# Patient Record
Sex: Female | Born: 1995 | Race: White | Hispanic: No | Marital: Single | Smoking: Never smoker
Health system: Southern US, Community
[De-identification: ages and names within clinical notes are randomized; demographics above are authoritative.]

## PROBLEM LIST (undated history)

## (undated) DIAGNOSIS — F32A Depression, unspecified: Secondary | ICD-10-CM

## (undated) DIAGNOSIS — F329 Major depressive disorder, single episode, unspecified: Secondary | ICD-10-CM

## (undated) HISTORY — DX: Major depressive disorder, single episode, unspecified: F32.9

## (undated) HISTORY — PX: OTHER SURGICAL HISTORY: SHX169

## (undated) HISTORY — DX: Depression, unspecified: F32.A

---
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
## 2013-06-28 ENCOUNTER — Encounter: Payer: Self-pay | Admitting: Family Medicine

## 2013-06-28 ENCOUNTER — Ambulatory Visit (INDEPENDENT_AMBULATORY_CARE_PROVIDER_SITE_OTHER): Payer: BC Managed Care – PPO | Admitting: Family Medicine

## 2013-06-28 VITALS — BP 123/69 | HR 88 | Wt 109.0 lb

## 2013-06-28 DIAGNOSIS — Z113 Encounter for screening for infections with a predominantly sexual mode of transmission: Secondary | ICD-10-CM

## 2013-06-28 DIAGNOSIS — Z309 Encounter for contraceptive management, unspecified: Secondary | ICD-10-CM

## 2013-06-28 DIAGNOSIS — E162 Hypoglycemia, unspecified: Secondary | ICD-10-CM

## 2013-06-28 MED ORDER — NORGESTIMATE-ETH ESTRADIOL 0.25-35 MG-MCG PO TABS: 1.0000 | ORAL_TABLET | Freq: Every day | ORAL | Status: DC

## 2013-06-28 NOTE — Progress Notes (Signed)
   Subjective:    Patient ID: [name], female    DOB: April 18, 1995, 18 y.o.   MRN: [id number]  HPI Here for followup contraceptive counseling. She is currently happy with her form of birth control pill. She does need refills today. She is currently sexual active. She's not experiencing any pelvic symptoms.  She has some questions about hypoglycemia. She had a syncopal episode after a bad house within couple hours after eating. She's also had a couple of episodes since then where she's woke up feeling a little shaky and with her lips tingling. She says her mom and her grandmother both have hypoglycemic episodes. No family history of significant diabetes. She wants to know she can do to prevent this.  Review of Systems     Objective:   Physical Exam  Constitutional: She is oriented to person, place, and time. She appears well-developed and well-nourished.  HENT:  Head: Normocephalic and atraumatic.  Cardiovascular: Normal rate, regular rhythm and normal heart sounds.   Pulmonary/Chest: Effort normal and breath sounds normal.  Neurological: She is alert and oriented to person, place, and time.  Skin: Skin is warm and dry.  Psychiatric: She has a normal mood and affect. Her behavior is normal.          Assessment & Plan:  contracpetive counseling-she's doing well on the medication. Refill sent to the pharmacy. Blood pressure is well-controlled. Due for yearly urine GC and Chlamydia testing. Followup in one year. Next year she will need a Pap smear.  Hypoglycemia-without actually testing her blood sugar is difficult to confirm the diagnosis 100%. The insurance would not pay for glucometer for that diagnosis. But we discussed eating small frequent meals to keep blood sugar elevated and strategies to treat low blood sugar when it does occur.

## 2013-06-29 LAB — GC/CHLAMYDIA PROBE AMP, URINE
Chlamydia, Swab/Urine, PCR: NEGATIVE
GC Probe Amp, Urine: NEGATIVE

## 2013-07-01 ENCOUNTER — Telehealth: Payer: Self-pay | Admitting: *Deleted

## 2013-07-01 NOTE — Telephone Encounter (Signed)
Error

## 2013-07-22 IMAGING — CR DG WRIST 2V*L*
2 series · 2 of 2 positions shown · non-contrast
Comparison: None.

CLINICAL DATA: Intermittent wrist pain for 1 year.  No previous
relevant surgery.

LEFT WRIST - 2 VIEW

[view not recorded (1 of 2)]
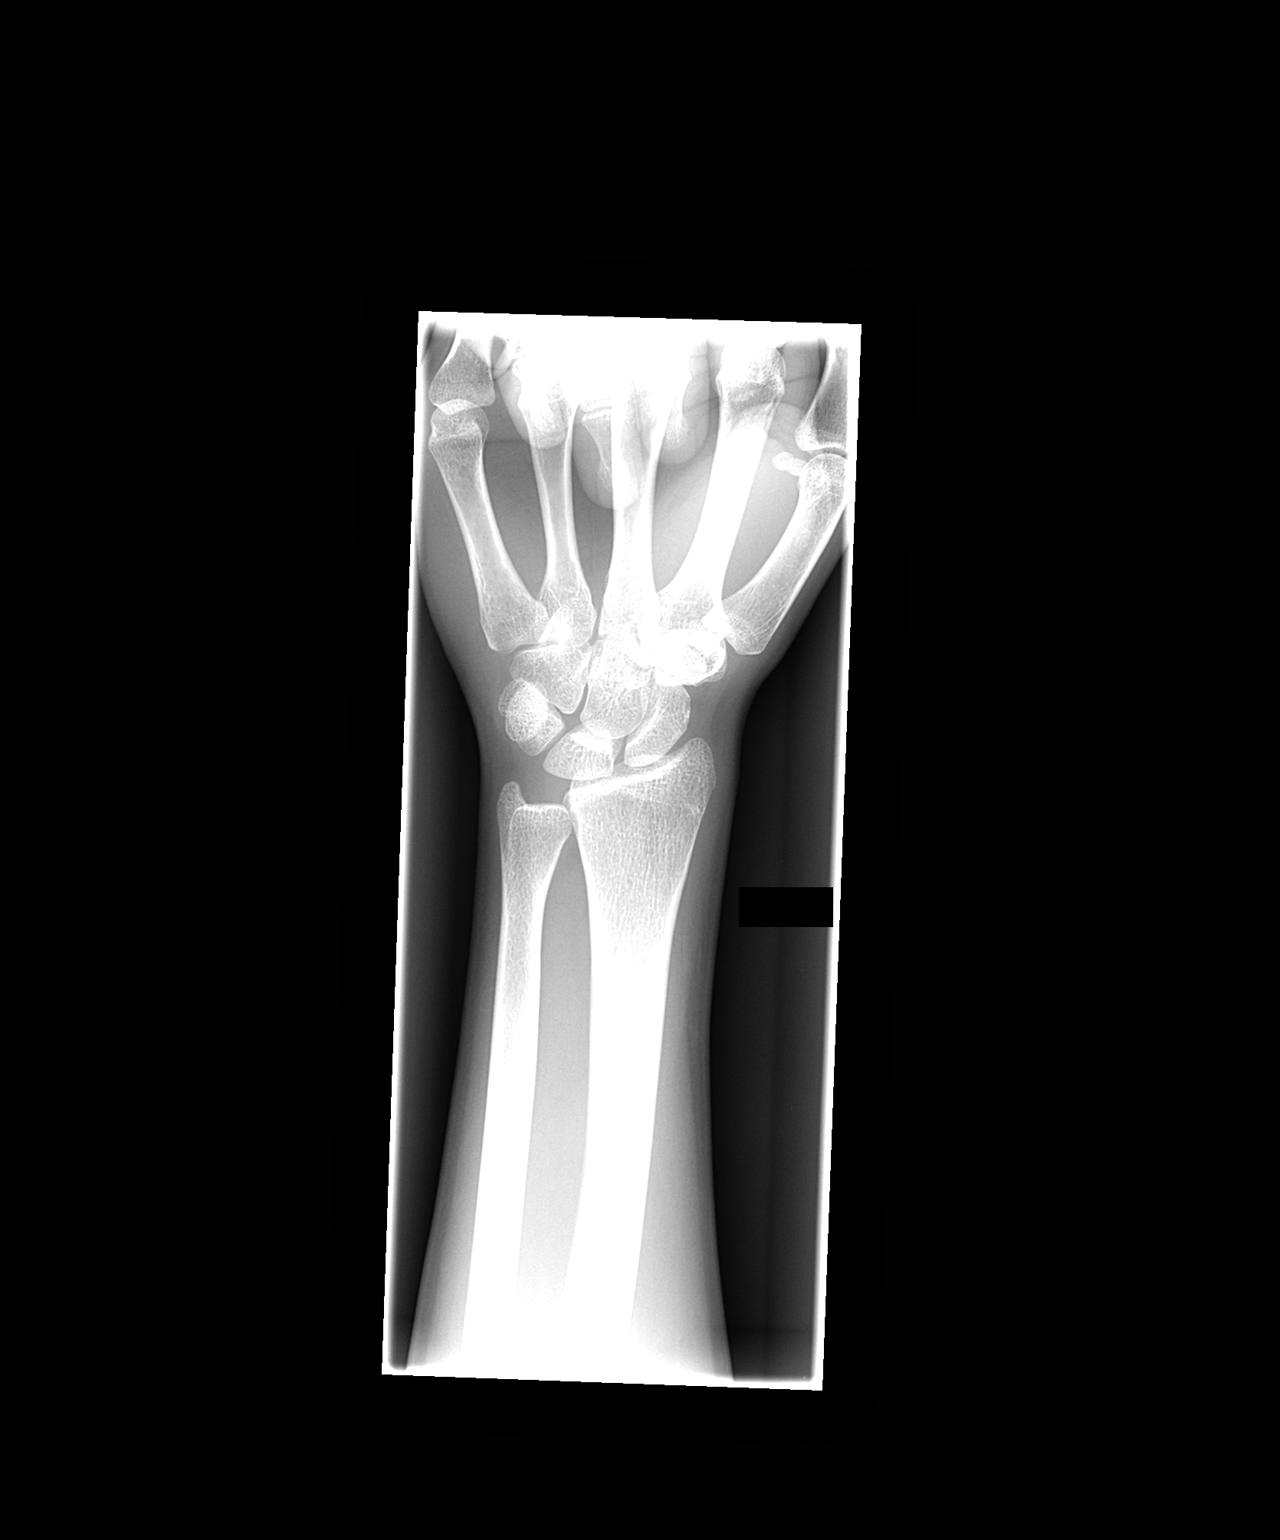

[view not recorded (2 of 2)]
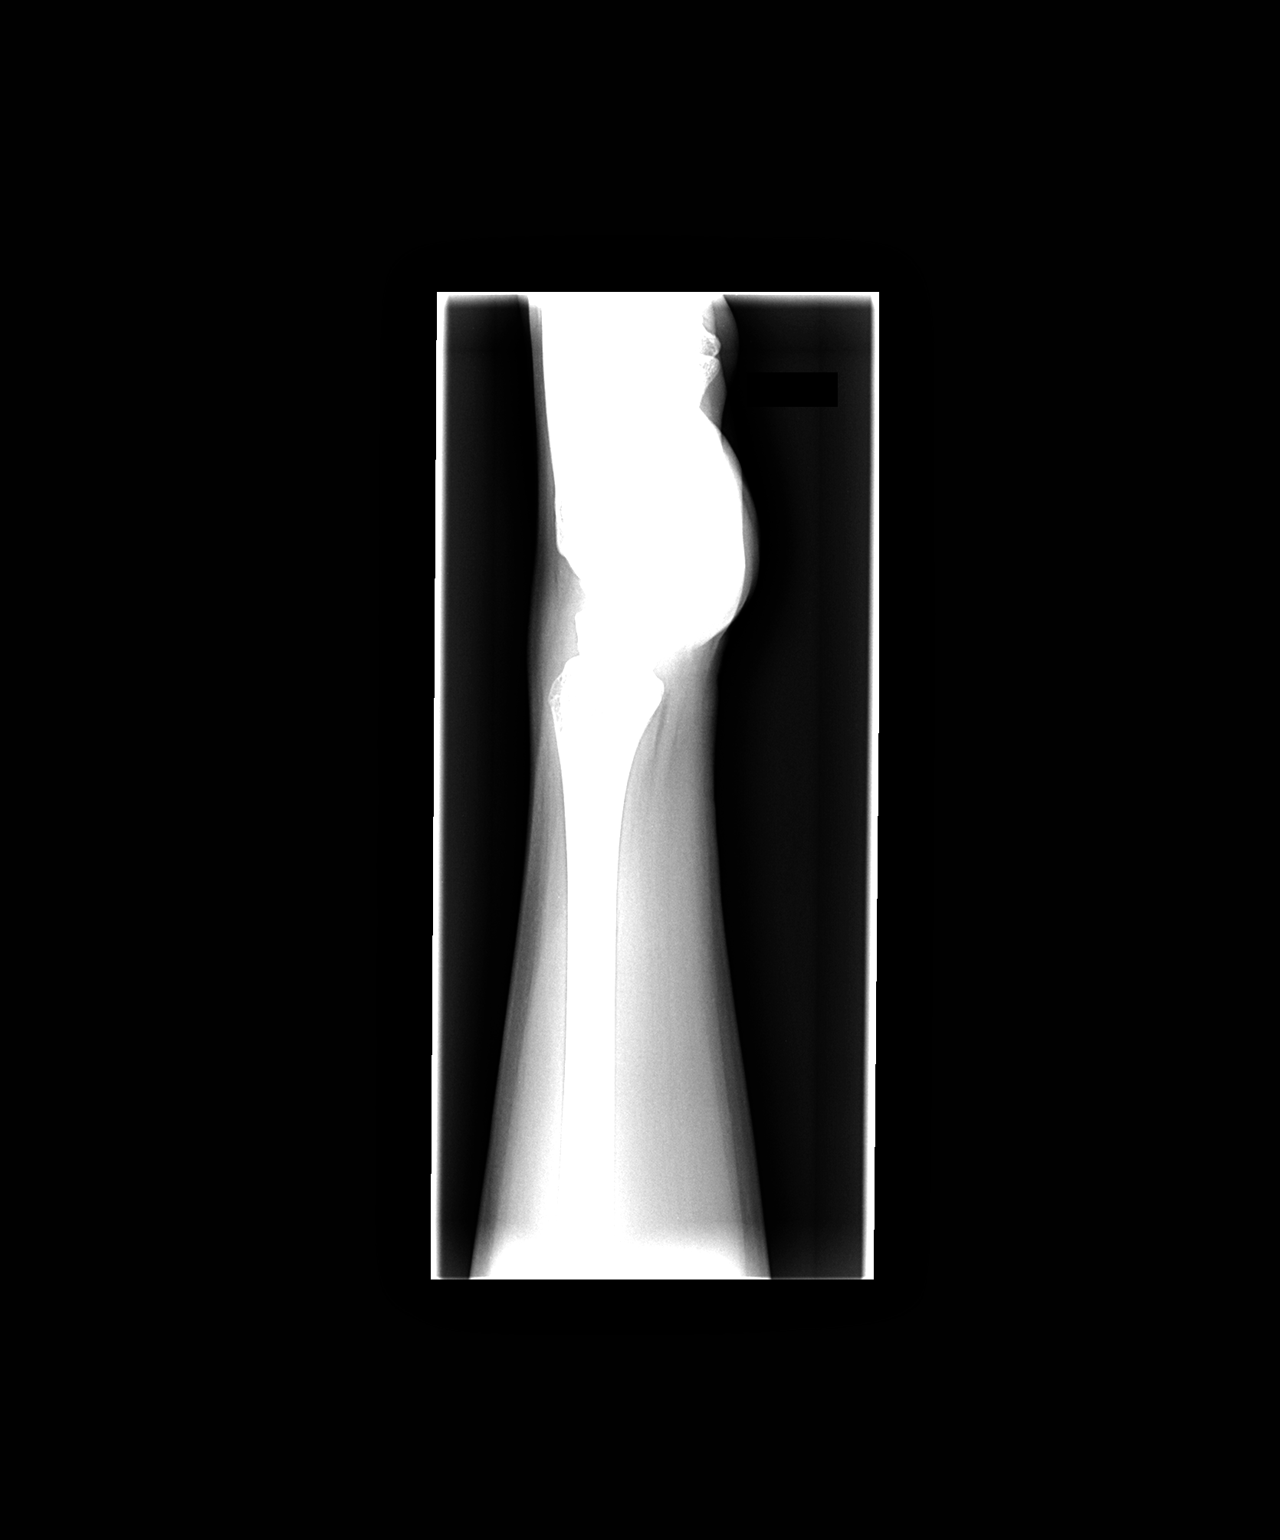

[2 of 2 positions shown; findings below may reference images not displayed]

FINDINGS: The mineralization and alignment are normal.  There is no
evidence of acute fracture or dislocation.  There is an ulnar minus
variance at the wrist.  No carpal bone abnormalities are
identified.
IMPRESSION: No acute osseous findings or malalignment.

## 2014-06-10 ENCOUNTER — Other Ambulatory Visit: Payer: Self-pay | Admitting: Family Medicine

## 2014-06-10 NOTE — Telephone Encounter (Signed)
Patient due for f/u in June 2016. Spoke with Pt, she will call us back to schedule her f/u once she can look at her calendar.

## 2014-07-14 ENCOUNTER — Ambulatory Visit (INDEPENDENT_AMBULATORY_CARE_PROVIDER_SITE_OTHER): Payer: BC Managed Care – PPO | Admitting: Family Medicine

## 2014-07-14 ENCOUNTER — Encounter: Payer: Self-pay | Admitting: Family Medicine

## 2014-07-14 VITALS — BP 119/81 | HR 77 | Ht 65.5 in | Wt 118.0 lb

## 2014-07-14 DIAGNOSIS — Z0000 Encounter for general adult medical examination without abnormal findings: Secondary | ICD-10-CM | POA: Diagnosis not present

## 2014-07-14 DIAGNOSIS — Z114 Encounter for screening for human immunodeficiency virus [HIV]: Secondary | ICD-10-CM

## 2014-07-14 DIAGNOSIS — Z23 Encounter for immunization: Secondary | ICD-10-CM | POA: Diagnosis not present

## 2014-07-14 MED ORDER — NORGESTIMATE-ETH ESTRADIOL 0.25-35 MG-MCG PO TABS: ORAL_TABLET | ORAL | Status: DC

## 2014-07-14 NOTE — Progress Notes (Signed)
  Subjective:     [name] is a 19 y.o. female and is here for a comprehensive physical exam. The patient reports no problems. Has a new job working at [employer].    History   Social History  . Marital Status: Single    Spouse Name: N/A  . Number of Children: N/A  . Years of Education: N/A   Occupational History  . Not on file.   Social History Main Topics  . Smoking status: Never Smoker   . Smokeless tobacco: Never Used  . Alcohol Use: Not on file  . Drug Use: Not on file  . Sexual Activity: Yes     Comment: lives with mother and father and brother, born in [place], now in HS.   Other Topics Concern  . Not on file   Social History Narrative   Works at [employer]. 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 HIV Screening  06/03/2008  . PAP SMEAR  06/04/2011  . TETANUS/TDAP  06/03/2012  . INFLUENZA VACCINE  08/25/2014    The following portions of the patient's history were reviewed and updated as appropriate: allergies, current medications, past family history, past medical history, past social history, past surgical history and problem list.  Review of Systems A comprehensive review of systems was negative.   Objective:    BP 119/81 mmHg  Pulse 77  Ht 5' 5.5" (1.664 m)  Wt 118 lb (53.524 kg)  BMI 19.33 kg/m2  LMP 07/11/2014 General appearance: alert, cooperative and appears stated age Head: Normocephalic, without obvious abnormality, atraumatic Eyes: conj clear, EOMi, PEERLA Ears: normal TM's and external ear canals both ears Nose: Nares normal. Septum midline. Mucosa normal. No drainage or sinus tenderness. Throat: lips, mucosa, and tongue normal; teeth and gums normal Neck: no adenopathy, no carotid bruit, no JVD, supple, symmetrical, trachea midline and thyroid not enlarged, symmetric, no tenderness/mass/nodules Back: symmetric, no curvature. ROM normal. No CVA tenderness. Lungs: clear to  auscultation bilaterally Breasts: normal appearance, no masses or tenderness Heart: regular rate and rhythm, S1, S2 normal, no murmur, click, rub or gallop Abdomen: soft, non-tender; bowel sounds normal; no masses,  no organomegaly Extremities: extremities normal, atraumatic, no cyanosis or edema Pulses: 2+ and symmetric Skin: Skin color, texture, turgor normal. No rashes or lesions Lymph nodes: Cervical, supraclavicular, and axillary nodes normal. Neurologic: Alert and oriented X 3, normal strength and tone. Normal symmetric reflexes. Normal coordination and gait    Assessment:    Healthy female exam.      Plan:     See After Visit Summary for Counseling Recommendations  Keep up a regular exercise program and make sure you are eating a healthy diet Try to eat 4 servings of dairy a day, or if you are lactose intolerant take a calcium with vitamin D daily.  Your vaccines are up to date.   Tdap given today.

## 2014-07-14 NOTE — Patient Instructions (Addendum)
Can schedule for cyst aspiration anytime.    Keep up a regular exercise program and make sure you are eating a healthy diet Try to eat 4 servings of dairy a day, or if you are lactose intolerant take a calcium with vitamin D daily.  Your vaccines are up to date.

## 2014-07-15 LAB — GC/CHLAMYDIA PROBE AMP, URINE
Chlamydia, Swab/Urine, PCR: NEGATIVE
GC Probe Amp, Urine: NEGATIVE

## 2014-07-16 LAB — COMPLETE METABOLIC PANEL WITH GFR

## 2014-07-16 LAB — LIPID PANEL

## 2014-07-16 LAB — HIV ANTIBODY (ROUTINE TESTING W REFLEX)

## 2014-08-20 ENCOUNTER — Encounter: Payer: Self-pay | Admitting: Family Medicine

## 2014-08-20 ENCOUNTER — Ambulatory Visit (INDEPENDENT_AMBULATORY_CARE_PROVIDER_SITE_OTHER): Payer: BC Managed Care – PPO | Admitting: Family Medicine

## 2014-08-20 ENCOUNTER — Other Ambulatory Visit (HOSPITAL_COMMUNITY)
Admission: RE | Admit: 2014-08-20 | Discharge: 2014-08-20 | Disposition: A | Discharge status: Home / Self Care | Payer: BC Managed Care – PPO | Source: Ambulatory Visit | Attending: Family Medicine | Admitting: Family Medicine

## 2014-08-20 VITALS — BP 92/66 | HR 107 | Ht 66.0 in | Wt 116.0 lb

## 2014-08-20 DIAGNOSIS — Z124 Encounter for screening for malignant neoplasm of cervix: Secondary | ICD-10-CM

## 2014-08-20 DIAGNOSIS — Z01419 Encounter for gynecological examination (general) (routine) without abnormal findings: Secondary | ICD-10-CM | POA: Diagnosis not present

## 2014-08-20 DIAGNOSIS — R55 Syncope and collapse: Secondary | ICD-10-CM | POA: Diagnosis not present

## 2014-08-20 DIAGNOSIS — M25532 Pain in left wrist: Secondary | ICD-10-CM

## 2014-08-20 DIAGNOSIS — M67432 Ganglion, left wrist: Secondary | ICD-10-CM

## 2014-08-20 NOTE — Patient Instructions (Signed)
Ice your left wrist and bruise on her right hip for 5-10 minutes a couple times today and tomorrow. Work on gentle stretches with the wrist. Over the weekend. Avoid excessive or heavy use of the wrist the next week or 2.

## 2014-08-20 NOTE — Progress Notes (Signed)
   Subjective:    Patient ID: ATALAYA ZAPPIA, female    DOB: Apr 14, 1995, 19 y.o.   MRN: [id number]  HPI Here today for Pap smear only. She had a complete physical recently but unfortunately was on her menstrual cycle so is coming back today to have her Pap smear completed.  She also has a ganglion cyst on the dorsum of the left wrist that she is went to have removed. She said it has been bothersome and sore occasionally.   Review of Systems     Objective:   Physical Exam  Constitutional: She is oriented to person, place, and time. She appears well-developed and well-nourished.  HENT:  Head: Normocephalic and atraumatic.  Cardiovascular: Normal rate and intact distal pulses.   Neurological: She is alert and oriented to person, place, and time.  Skin: Skin is warm and dry.  Psychiatric: She has a normal mood and affect.          Assessment & Plan:  Cervical cancer screening-Pap smear performed.  Ganglion cyst-discussed procedure. She would prefer to have it aspirated.  Syncope - patient recovered very quickly. We made sure she felt well and blood pressure was back up before she was sent home. Encouraged her to rest today and to get something to eat and hydrate.  Aspiration/Injection Procedure Note BELICIA DIFATTA [id number] [birth year]  Procedure: Aspiration Indications: ganglion cyst left wrist, dorsum.   Procedure Details Consent: Risks of procedure as well as the alternatives and risks of each were explained to the (patient/caregiver).  Consent for procedure obtained. Time Out: Verified patient identification, verified procedure, site/side was marked, verified correct patient position, special equipment/implants available, medications/allergies/relevent history reviewed, required imaging and test results available.  Performed   Local Anesthesia Used:Lidocaine 1% plain; 1mL Amount of Fluid Aspirated: minimal amount Character of Fluid: gelatinous .  1 cc of   kenalog injected to prevent recurrent of cyst.  A sterile dressing was applied.  Patient did not tolerate procedure well. Estimated blood loss: 0 ml  METHENEY,CATHERINE 08/20/2014, 10:58 AM   Unfortunately at the very end of the procedure patient had a syncopal episode. Her boyfriend and was in the rain so we lifted her up to get her on the table so that she was lying flat. She recovered very quickly. We kept her reclined for about 15 minutes to make sure that she was okay before sitting up and leaving the office. Cold towels were placed on her forehead. She did suffer a bruise to the right hip. We did put ice on this before she left. She reports that she had passed out a few years ago in high school during orientation. She said she hadn't eaten today.  Event entered into safety zone portal.

## 2014-08-22 LAB — CYTOLOGY - PAP

## 2014-10-15 ENCOUNTER — Telehealth: Payer: Self-pay

## 2014-10-15 NOTE — Telephone Encounter (Signed)
Ok for letter

## 2014-10-15 NOTE — Telephone Encounter (Signed)
Letter left up front for pick up. Patient is aware.  

## 2014-10-15 NOTE — Telephone Encounter (Signed)
Khristin would like a letter stating she needs her cat to live with her for emotional support.

## 2015-06-12 ENCOUNTER — Other Ambulatory Visit: Payer: Self-pay | Admitting: Family Medicine

## 2015-08-07 ENCOUNTER — Ambulatory Visit (INDEPENDENT_AMBULATORY_CARE_PROVIDER_SITE_OTHER): Payer: BC Managed Care – PPO | Admitting: Family Medicine

## 2015-08-07 ENCOUNTER — Other Ambulatory Visit: Payer: Self-pay | Admitting: Family Medicine

## 2015-08-07 ENCOUNTER — Encounter: Payer: Self-pay | Admitting: Family Medicine

## 2015-08-07 VITALS — BP 122/69 | HR 106 | Ht 66.0 in | Wt 123.0 lb

## 2015-08-07 DIAGNOSIS — Z0000 Encounter for general adult medical examination without abnormal findings: Secondary | ICD-10-CM | POA: Diagnosis not present

## 2015-08-07 DIAGNOSIS — N760 Acute vaginitis: Secondary | ICD-10-CM | POA: Diagnosis not present

## 2015-08-07 DIAGNOSIS — B356 Tinea cruris: Secondary | ICD-10-CM | POA: Diagnosis not present

## 2015-08-07 DIAGNOSIS — Z113 Encounter for screening for infections with a predominantly sexual mode of transmission: Secondary | ICD-10-CM | POA: Diagnosis not present

## 2015-08-07 DIAGNOSIS — B359 Dermatophytosis, unspecified: Secondary | ICD-10-CM

## 2015-08-07 LAB — WET PREP, GENITAL
CLUE CELLS WET PREP: NONE SEEN
Trich, Wet Prep: NONE SEEN
WBC, Wet Prep HPF POC: NONE SEEN
Yeast Wet Prep HPF POC: NONE SEEN

## 2015-08-07 MED ORDER — NYSTATIN 100000 UNIT/GM EX CREA: 1.0000 "application " | TOPICAL_CREAM | Freq: Two times a day (BID) | CUTANEOUS | Status: DC

## 2015-08-07 NOTE — Progress Notes (Signed)
Subjective:     [name] is a 20 y.o. female and is here for a comprehensive physical exam. The patient reports problems - She has 2 circular rashes on her right upper thigh. She thought it was probably ringworm and started using topical [place] is all. But she really only use it consistently for about 3 or 4 days..No itching or irritation.  She has an irritated pink spot on the skin b/t the rectum and vagina.   She recently had a vaginal yeast infection and use over-the-counter treatment. She does feel like that has resolved but over the last few days has noted the irritated skin.  She is currently menstruating.    Social History   Social History  . Marital Status: Single    Spouse Name: N/A  . Number of Children: N/A  . Years of Education: N/A   Occupational History  . Not on file.   Social History Main Topics  . Smoking status: Never Smoker   . Smokeless tobacco: Never Used  . Alcohol Use: Not on file  . Drug Use: Not on file  . Sexual Activity: Yes     Comment: lives with mother and father and brother, born in [place], now in HS.   Other Topics Concern  . Not on file   Social History Narrative   Works at [employer]. 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 INFLUENZA VACCINE  08/25/2015  . PAP SMEAR  08/19/2017  . TETANUS/TDAP  07/13/2024  . HIV Screening  Completed    The following portions of the patient's history were reviewed and updated as appropriate: allergies, current medications, past family history, past medical history, past social history, past surgical history and problem list.  Review of Systems A comprehensive review of systems was negative.   Objective:    BP 122/69 mmHg  Pulse 106  Ht  (1.676 m)  Wt 123 lb (55.792 kg)  BMI 19.86 kg/m2  SpO2 100%  LMP 08/07/2015 (Exact Date) General appearance: alert, cooperative and appears stated age Head: Normocephalic, without obvious  abnormality, atraumatic Eyes: conj clear, EOMI, PEERLA Ears: normal TM's and external ear canals both ears Nose: Nares normal. Septum midline. Mucosa normal. No drainage or sinus tenderness. Throat: lips, mucosa, and tongue normal; teeth and gums normal Neck: no adenopathy, no carotid bruit, no JVD, supple, symmetrical, trachea midline and thyroid not enlarged, symmetric, no tenderness/mass/nodules Back: symmetric, no curvature. ROM normal. No CVA tenderness. Lungs: clear to auscultation bilaterally Breasts: normal appearance, no masses or tenderness Heart: regular rate and rhythm, S1, S2 normal, no murmur, click, rub or gallop Abdomen: soft, non-tender; bowel sounds normal; no masses,  no organomegaly Pelvic: external genitalia normal and rectovaginal septum is midlly swollen and irritate with some erythematous satellilte papules.  Extremities: extremities normal, atraumatic, no cyanosis or edema Pulses: 2+ and symmetric Skin: 2 circular rashes on right thigh. Lymph nodes: Cervical, supraclavicular, and axillary nodes normal. Neurologic: Alert and oriented X 3, normal strength and tone. Normal symmetric reflexes. Normal coordination and gait    Assessment:    Healthy female exam.      Plan:     See After Visit Summary for Counseling Recommendations  Keep up a regular exercise program and make sure you are eating a healthy diet Try to eat 4 servings of dairy a day, or if you are lactose intolerant take a calcium with vitamin D daily.  Your vaccines are up to date.  Labs slip given.   Rash-suspect remote ringworm based on exam today. KOH skin scraping performed. Continue itraconazole BID.  Tinae cruris - will tx with topical nystatin cream. Call if sxs not resolving.

## 2015-08-07 NOTE — Patient Instructions (Signed)
Restart your itraconazole twice a day on your leg.    Keep up a regular exercise program and make sure you are eating a healthy diet Try to eat 4 servings of dairy a day, or if you are lactose intolerant take a calcium with vitamin D daily.  Your vaccines are up to date.

## 2015-08-08 LAB — GC/CHLAMYDIA PROBE AMP
CT Probe RNA: NOT DETECTED
GC Probe RNA: NOT DETECTED

## 2015-08-10 LAB — FUNGAL STAIN

## 2015-08-20 ENCOUNTER — Encounter: Payer: BC Managed Care – PPO | Admitting: Family Medicine

## 2015-08-21 ENCOUNTER — Encounter: Payer: BC Managed Care – PPO | Admitting: Family Medicine

## 2015-08-21 ENCOUNTER — Ambulatory Visit: Payer: BC Managed Care – PPO | Admitting: Family Medicine

## 2015-11-06 LAB — LIPID PANEL
Cholesterol: 114 mg/dL — ABNORMAL LOW (ref 125–200)
HDL: 49 mg/dL (ref >=46)
LDL CALC: 58 mg/dL (ref <130)
Total CHOL/HDL Ratio: 2.3 Ratio (ref <=5.0)
Triglycerides: 36 mg/dL (ref <150)
VLDL: 7 mg/dL (ref <30)

## 2015-11-06 LAB — COMPLETE METABOLIC PANEL WITH GFR
ALT: 20 U/L (ref 6–29)
AST: 19 U/L (ref 10–30)
Albumin: 4.6 g/dL (ref 3.6–5.1)
Alkaline Phosphatase: 53 U/L (ref 33–115)
BUN: 19 mg/dL (ref 7–25)
CO2: 23 mmol/L (ref 20–31)
CREATININE: 0.76 mg/dL (ref 0.50–1.10)
Calcium: 9.5 mg/dL (ref 8.6–10.2)
Chloride: 108 mmol/L (ref 98–110)
GFR, Est African American: >89 mL/min (ref >=60)
GFR, Est Non African American: >89 mL/min (ref >=60)
Glucose, Bld: 88 mg/dL (ref 65–99)
Potassium: 4.7 mmol/L (ref 3.5–5.3)
Sodium: 141 mmol/L (ref 135–146)
Total Bilirubin: 0.6 mg/dL (ref 0.2–1.2)
Total Protein: 7.1 g/dL (ref 6.1–8.1)

## 2015-11-09 NOTE — Progress Notes (Signed)
All labs are normal. 

## 2016-07-25 ENCOUNTER — Ambulatory Visit (INDEPENDENT_AMBULATORY_CARE_PROVIDER_SITE_OTHER): Payer: BC Managed Care – PPO | Admitting: Family Medicine

## 2016-07-25 ENCOUNTER — Encounter: Payer: Self-pay | Admitting: Family Medicine

## 2016-07-25 ENCOUNTER — Telehealth: Payer: Self-pay | Admitting: *Deleted

## 2016-07-25 VITALS — BP 115/78 | HR 93 | Resp 16 | Ht 64.5 in | Wt 118.5 lb

## 2016-07-25 DIAGNOSIS — Z0000 Encounter for general adult medical examination without abnormal findings: Secondary | ICD-10-CM | POA: Diagnosis not present

## 2016-07-25 DIAGNOSIS — F909 Attention-deficit hyperactivity disorder, unspecified type: Secondary | ICD-10-CM | POA: Insufficient documentation

## 2016-07-25 DIAGNOSIS — F902 Attention-deficit hyperactivity disorder, combined type: Secondary | ICD-10-CM | POA: Diagnosis not present

## 2016-07-25 MED ORDER — AMPHETAMINE-DEXTROAMPHET ER 10 MG PO CP24: 10.0000 mg | ORAL_CAPSULE | Freq: Every day | ORAL | 0 refills | Status: DC

## 2016-07-25 MED ORDER — AMPHETAMINE-DEXTROAMPHETAMINE 10 MG PO TABS: 10.0000 mg | ORAL_TABLET | Freq: Two times a day (BID) | ORAL | 0 refills | Status: DC

## 2016-07-25 NOTE — Patient Instructions (Addendum)
Keep up a regular exercise program and make sure you are eating a healthy diet Try to eat 4 servings of dairy a day, or if you are lactose intolerant take a calcium with vitamin D daily.  Your vaccines are up to date.    Follow up approx 1 month after starting the Adderral.

## 2016-07-25 NOTE — Telephone Encounter (Signed)
Okay, will change Adderall to 10 mg tab twice a day. She'll take her first one first thing in the morning and the second one about 5 hours later. She wanted to wear off by the evening so it is not affecting her sleep.

## 2016-07-25 NOTE — Progress Notes (Signed)
Subjective:     Jill RivesCassandra E Spears is a 21 y.o. female and is here for a comprehensive physical exam. The patient reports no problems. She has really been struggling with her ADHD. Was dx back in 2008 and took Vyvanse for a time.  She did well in HS and undergrad without it.  She is not having a hard time getting things done. She is forgetting small things. She often cuts people off in conversations. 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  She recently was except that school back in February and will start at the end of August. [facility] [employer] [facility].  Social History   Social History  . Marital status: Single    Spouse name: N/A  . Number of children: N/A  . Years of education: N/A   Occupational History  . Not on file.   Social History Main Topics  . Smoking status: Never Smoker  . Smokeless tobacco: Never Used  . Alcohol use Not on file  . Drug use: Unknown  . Sexual activity: Yes     Comment: lives with mother and father and [place], now in [place].   Other Topics Concern  . Not on file   Social History Narrative   Works at [employer]. 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 INFLUENZA VACCINE  08/24/2016  . PAP SMEAR  08/19/2017  . TETANUS/TDAP  07/13/2024  . HIV Screening  Completed    The following portions of the patient's history were reviewed and updated as appropriate: allergies, current medications, past family history, past medical history, past social history, past surgical history and problem list.  Review of Systems A comprehensive review of systems was negative.   Objective:    BP 115/78   Pulse 93   Resp 16   Ht 5' 4.5" (1.638 m)   Wt 118 lb 8 oz (53.8 kg)   BMI 20.03 kg/m  General appearance: alert, cooperative and appears stated age Head: Normocephalic, without obvious abnormality, atraumatic Eyes: conj clear, EOMI,  PEERLA Ears: normal TM's and external ear canals both ears Nose: Nares normal. Septum midline. Mucosa normal. No drainage or sinus tenderness. Throat: lips, mucosa, and tongue normal; teeth and gums normal Neck: no adenopathy, no carotid bruit, no JVD, supple, symmetrical, trachea midline and thyroid not enlarged, symmetric, no tenderness/mass/nodules Back: symmetric, no curvature. ROM normal. No CVA tenderness. Lungs: clear to auscultation bilaterally Heart: regular rate and rhythm, S1, S2 normal, no murmur, click, rub or gallop Abdomen: soft, non-tender; bowel sounds normal; no masses,  no organomegaly Extremities: extremities normal, atraumatic, no cyanosis or edema Pulses: 2+ and symmetric Skin: Skin color, texture, turgor normal. No rashes or lesions Lymph nodes: Cervical, supraclavicular, and axillary nodes normal. Neurologic: Alert and oriented X 3, normal strength and tone. Normal symmetric reflexes. Normal coordination and gait    Assessment:    Healthy female exam.      Plan:     See After Visit Summary for Counseling Recommendations   Keep up a regular exercise program and make sure you are eating a healthy diet Try to eat 4 servings of dairy a day, or if you are lactose intolerant take a calcium with vitamin D daily.  Your vaccines are up to date.   She is planning o n traveling to [place] around December. Did discuss  at least getting her hepatitis A vaccination. She will check with her insurance as she if it's covered. Also seen in she is aware of her travel itinerary to let me know so that I can better tell her what other vaccine she may need.  ADHD - Will restart Medication. Will try to get old records.  Will start Adderall for cost reasons. She wants to wait until school starts to take it.  Will need to f/u one month after starting med for BP check, etc.

## 2016-07-25 NOTE — Telephone Encounter (Signed)
Pt called and lvm asking if her adderall can be switched to tablets because this will be cheaper for her instead of the extended release capsules. Will fwd to pcp for advice.Loralee PacasBarkley, Amery Minasyan SilkworthLynetta

## 2016-07-26 MED ORDER — AMPHETAMINE-DEXTROAMPHETAMINE 20 MG PO TABS: ORAL_TABLET | ORAL | 0 refills | Status: DC

## 2016-07-26 NOTE — Telephone Encounter (Signed)
Pt informed.[name] [name]  

## 2016-07-26 NOTE — Telephone Encounter (Signed)
Pt informed.[name]  

## 2016-07-26 NOTE — Addendum Note (Signed)
Addended by: Deno EtienneBARKLEY, Rayleigh Gillyard L on: 07/26/2016 08:31 AM   Modules accepted: Orders

## 2016-07-26 NOTE — Telephone Encounter (Signed)
Pt called back and stated that she went online and the cost of the 20 mg is cheaper (around $12) and it can be cut in half. She is asking if this can be sent instead. Will print new rx and place up front.Loralee PacasBarkley, [name] WaverlyLynetta

## 2016-08-29 ENCOUNTER — Ambulatory Visit (INDEPENDENT_AMBULATORY_CARE_PROVIDER_SITE_OTHER): Payer: BC Managed Care – PPO | Admitting: Family Medicine

## 2016-08-29 VITALS — BP 132/82 | HR 96 | Wt 119.0 lb

## 2016-08-29 DIAGNOSIS — F902 Attention-deficit hyperactivity disorder, combined type: Secondary | ICD-10-CM | POA: Diagnosis not present

## 2016-08-29 MED ORDER — AMPHETAMINE-DEXTROAMPHETAMINE 20 MG PO TABS: ORAL_TABLET | ORAL | 0 refills | Status: DC

## 2016-08-29 NOTE — Progress Notes (Signed)
   Subjective:    Patient ID: [name], female    DOB: Jul 02, 1995, 21 y.o.   MRN: [id number]  HPI F/U ADHD - She is here to follow-up. Should've prior diagnosis of ADHD but had not been on medications for years. Now that she is actually starting that school she wanted to get back on medications. She had orientation which lasted all day for almost a week and started her medication during that time. She says it was actually extremely helpful in keeping her focus. She's actually been taking half of a tab twice a day. The first dose around 7 AM and the second dose around 12:48 PM and that has been effective. She denies any chest pain, palpitations, anorexia, or abnormal weight loss on the medication.   Review of Systems     Objective:   Physical Exam  Constitutional: She is oriented to person, place, and time. She appears well-developed and well-nourished.  HENT:  Head: Normocephalic and atraumatic.  Cardiovascular: Normal rate, regular rhythm and normal heart sounds.   Pulmonary/Chest: Effort normal and breath sounds normal.  Neurological: She is alert and oriented to person, place, and time.  Skin: Skin is warm and dry.  Psychiatric: She has a normal mood and affect. Her behavior is normal.          Assessment & Plan:  ADHD - Doing very well on current regimen. Prescriptions given for 4 months until she can follow-up in December while she is on Christmas break. She has any problems in the interim them please let me know. Continue to monitor blood pressure and pulse.

## 2017-01-10 ENCOUNTER — Encounter: Payer: Self-pay | Admitting: Family Medicine

## 2017-01-10 ENCOUNTER — Ambulatory Visit (INDEPENDENT_AMBULATORY_CARE_PROVIDER_SITE_OTHER): Payer: BC Managed Care – PPO | Admitting: Family Medicine

## 2017-01-10 VITALS — BP 120/57 | HR 81 | Ht 65.0 in | Wt 121.0 lb

## 2017-01-10 DIAGNOSIS — Z23 Encounter for immunization: Secondary | ICD-10-CM

## 2017-01-10 DIAGNOSIS — F902 Attention-deficit hyperactivity disorder, combined type: Secondary | ICD-10-CM | POA: Diagnosis not present

## 2017-01-10 MED ORDER — AMPHETAMINE-DEXTROAMPHETAMINE 20 MG PO TABS: ORAL_TABLET | ORAL | 0 refills | Status: DC

## 2017-01-10 NOTE — Progress Notes (Signed)
   Subjective:    Patient ID: [name], female    DOB: 1995/02/27, 21 y.o.   MRN: [id number]  HPI F/U ADHD -she is doing really well overall.  Her first semester of at school she did fantastic and has 3.6 GPA.  She likes the flexibility of being able to take her medication twice a day.  Some days if she has a more light day then she will skip it in the afternoon.  Denies any chest pain, shortness breath, palpitations on the medication.  She does occasionally drink coffee with it but tries not to do that regularly.   Review of Systems     Objective:   Physical Exam  Constitutional: She is oriented to person, place, and time. She appears well-developed and well-nourished.  HENT:  Head: Normocephalic and atraumatic.  Cardiovascular: Normal rate, regular rhythm and normal heart sounds.  Pulmonary/Chest: Effort normal and breath sounds normal.  Neurological: She is alert and oriented to person, place, and time.  Skin: Skin is warm and dry.  Psychiatric: She has a normal mood and affect. Her behavior is normal.       Assessment & Plan:  ADHD -to new current regimen.  We will send over next 3 [place]  Follow-up in 4 months.  Call if any problems or symptoms or side effects.  Flu vaccine given today.

## 2017-06-15 ENCOUNTER — Other Ambulatory Visit: Payer: Self-pay

## 2017-06-15 MED ORDER — AMPHETAMINE-DEXTROAMPHETAMINE 20 MG PO TABS: ORAL_TABLET | ORAL | 0 refills | Status: DC

## 2017-06-15 NOTE — Telephone Encounter (Signed)
Pt called requesting RF on her Adderall.   Last OV was 01-10-18 F/U appt sched for 06-27-17   RX pended. Please send if appropriate  Thanks!

## 2017-06-27 ENCOUNTER — Encounter: Payer: Self-pay | Admitting: Family Medicine

## 2017-06-27 ENCOUNTER — Ambulatory Visit: Payer: BC Managed Care – PPO | Admitting: Family Medicine

## 2017-06-27 VITALS — BP 111/59 | HR 90 | Ht 65.0 in | Wt 123.0 lb

## 2017-06-27 DIAGNOSIS — Z5181 Encounter for therapeutic drug level monitoring: Secondary | ICD-10-CM

## 2017-06-27 DIAGNOSIS — F902 Attention-deficit hyperactivity disorder, combined type: Secondary | ICD-10-CM

## 2017-06-27 DIAGNOSIS — Z113 Encounter for screening for infections with a predominantly sexual mode of transmission: Secondary | ICD-10-CM | POA: Diagnosis not present

## 2017-06-27 DIAGNOSIS — Z13220 Encounter for screening for lipoid disorders: Secondary | ICD-10-CM | POA: Diagnosis not present

## 2017-06-27 MED ORDER — AMPHETAMINE-DEXTROAMPHETAMINE 20 MG PO TABS: ORAL_TABLET | ORAL | 0 refills | Status: DC

## 2017-06-27 NOTE — Progress Notes (Signed)
Subjective:    CC: ADHD  HPI:  ADHD -she is doing well on her current medication.  No chest pain shortness of breath palpitations or insomnia.  No change in bowels.  She is happy with her current regimen and says it seems to be effective at work and at school.   Last Pap smear on file was July 2016 so she will be due for 3-year recall this July.  She initially thought that she had had one done in 2017 with her OB/GYN but looking back through the records she did have a Nexplanon inserted but did not actually have a Pap smear.  She is due for yearly gonorrhea and Chlamydia testing.  She is in a monogamous relationship.   Past medical history, Surgical history, Family history not pertinant except as noted below, Social history, Allergies, and medications have been entered into the medical record, reviewed, and corrections made.   Review of Systems: No fevers, chills, night sweats, weight loss, chest pain, or shortness of breath.   Objective:    General: Well Developed, well nourished, and in no acute distress.  Neuro: Alert and oriented x3, extra-ocular muscles intact, sensation grossly intact.  HEENT: Normocephalic, atraumatic  Skin: Warm and dry, no rashes. Cardiac: Regular rate and rhythm, no murmurs rubs or gallops, no lower extremity edema.  Respiratory: Clear to auscultation bilaterally. Not using accessory muscles, speaking in full sentences.   Impression and Recommendations:    ADHD - BP at  Goal.   Continue current regimen.  Medication refill sent.  Follow-up in 6 months.  Call if any problems or concerns.  STD screening-due for yearly gonorrhea and Chlamydia testing.  She does follow with OB/GYN and reports that her Pap smear is up-to-date and was done in 2017.  [facility] is [facility].

## 2017-06-29 LAB — COMPLETE METABOLIC PANEL WITH GFR
AG Ratio: 1.7 (calc) (ref 1.0–2.5)
ALBUMIN MSPROF: 4.2 g/dL (ref 3.6–5.1)
ALT: 11 U/L (ref 6–29)
AST: 14 U/L (ref 10–30)
Alkaline phosphatase (APISO): 39 U/L (ref 33–115)
BILIRUBIN TOTAL: 0.3 mg/dL (ref 0.2–1.2)
BUN: 10 mg/dL (ref 7–25)
CALCIUM: 9.2 mg/dL (ref 8.6–10.2)
CHLORIDE: 105 mmol/L (ref 98–110)
CO2: 26 mmol/L (ref 20–32)
CREATININE: 0.73 mg/dL (ref 0.50–1.10)
GFR, EST AFRICAN AMERICAN: 134 mL/min/{1.73_m2} (ref >=60)
GFR, EST NON AFRICAN AMERICAN: 115 mL/min/{1.73_m2} (ref >=60)
GLUCOSE: 91 mg/dL (ref 65–99)
Globulin: 2.5 g/dL (calc) (ref 1.9–3.7)
Potassium: 4.3 mmol/L (ref 3.5–5.3)
Sodium: 139 mmol/L (ref 135–146)
TOTAL PROTEIN: 6.7 g/dL (ref 6.1–8.1)

## 2017-06-29 LAB — CBC
HEMATOCRIT: 42.3 % (ref 35.0–45.0)
HEMOGLOBIN: 14.1 g/dL (ref 11.7–15.5)
MCH: 30.7 pg (ref 27.0–33.0)
MCHC: 33.3 g/dL (ref 32.0–36.0)
MCV: 92 fL (ref 80.0–100.0)
MPV: 10 fL (ref 7.5–12.5)
Platelets: 217 10*3/uL (ref 140–400)
RBC: 4.6 10*6/uL (ref 3.80–5.10)
RDW: 11.9 % (ref 11.0–15.0)
WBC: 4.9 10*3/uL (ref 3.8–10.8)

## 2017-06-29 LAB — C. TRACHOMATIS/N. GONORRHOEAE RNA
C. trachomatis RNA, TMA: NOT DETECTED
N. GONORRHOEAE RNA, TMA: NOT DETECTED

## 2017-06-29 LAB — LIPID PANEL
CHOL/HDL RATIO: 2.4 (calc) (ref <5.0)
Cholesterol: 119 mg/dL (ref <200)
HDL: 49 mg/dL — ABNORMAL LOW (ref >50)
LDL Cholesterol (Calc): 56 mg/dL (calc)
NON-HDL CHOLESTEROL (CALC): 70 mg/dL (ref <130)
TRIGLYCERIDES: 65 mg/dL (ref <150)

## 2017-07-17 ENCOUNTER — Encounter: Payer: Self-pay | Admitting: Family Medicine

## 2018-01-24 HISTORY — PX: PLACEMENT OF BREAST IMPLANTS: SHX6334

## 2019-02-11 ENCOUNTER — Encounter: Payer: Self-pay | Admitting: Family Medicine

## 2019-02-11 ENCOUNTER — Telehealth: Payer: Self-pay | Admitting: Family Medicine

## 2019-02-11 ENCOUNTER — Other Ambulatory Visit: Payer: Self-pay

## 2019-02-11 ENCOUNTER — Ambulatory Visit (INDEPENDENT_AMBULATORY_CARE_PROVIDER_SITE_OTHER): Payer: BC Managed Care – PPO | Admitting: Family Medicine

## 2019-02-11 ENCOUNTER — Other Ambulatory Visit (HOSPITAL_COMMUNITY)
Admission: RE | Admit: 2019-02-11 | Discharge: 2019-02-11 | Disposition: A | Discharge status: Home / Self Care | Payer: BC Managed Care – PPO | Source: Ambulatory Visit | Attending: Family Medicine | Admitting: Family Medicine

## 2019-02-11 VITALS — BP 113/74 | HR 100 | Ht 65.0 in | Wt 114.0 lb

## 2019-02-11 DIAGNOSIS — Z124 Encounter for screening for malignant neoplasm of cervix: Secondary | ICD-10-CM | POA: Insufficient documentation

## 2019-02-11 DIAGNOSIS — Z0000 Encounter for general adult medical examination without abnormal findings: Secondary | ICD-10-CM

## 2019-02-11 DIAGNOSIS — F902 Attention-deficit hyperactivity disorder, combined type: Secondary | ICD-10-CM | POA: Diagnosis not present

## 2019-02-11 DIAGNOSIS — Z23 Encounter for immunization: Secondary | ICD-10-CM

## 2019-02-11 LAB — COMPLETE METABOLIC PANEL WITH GFR
AG Ratio: 1.6 (calc) (ref 1.0–2.5)
ALT: 12 U/L (ref 6–29)
AST: 15 U/L (ref 10–30)
Albumin: 4.3 g/dL (ref 3.6–5.1)
Alkaline phosphatase (APISO): 41 U/L (ref 31–125)
BUN: 12 mg/dL (ref 7–25)
CO2: 27 mmol/L (ref 20–32)
Calcium: 9.5 mg/dL (ref 8.6–10.2)
Chloride: 103 mmol/L (ref 98–110)
Creat: 0.78 mg/dL (ref 0.50–1.10)
GFR, Est African American: 122 mL/min/{1.73_m2} (ref >=60)
GFR, Est Non African American: 106 mL/min/{1.73_m2} (ref >=60)
Globulin: 2.7 g/dL (calc) (ref 1.9–3.7)
Glucose, Bld: 83 mg/dL (ref 65–99)
Potassium: 4.5 mmol/L (ref 3.5–5.3)
Sodium: 139 mmol/L (ref 135–146)
Total Bilirubin: 0.5 mg/dL (ref 0.2–1.2)
Total Protein: 7 g/dL (ref 6.1–8.1)

## 2019-02-11 LAB — CBC
HCT: 46.1 % — ABNORMAL HIGH (ref 35.0–45.0)
Hemoglobin: 15.3 g/dL (ref 11.7–15.5)
MCH: 30.7 pg (ref 27.0–33.0)
MCHC: 33.2 g/dL (ref 32.0–36.0)
MCV: 92.4 fL (ref 80.0–100.0)
MPV: 9.7 fL (ref 7.5–12.5)
Platelets: 214 10*3/uL (ref 140–400)
RBC: 4.99 10*6/uL (ref 3.80–5.10)
RDW: 11.8 % (ref 11.0–15.0)
WBC: 4.2 10*3/uL (ref 3.8–10.8)

## 2019-02-11 LAB — LIPID PANEL W/REFLEX DIRECT LDL
Cholesterol: 122 mg/dL (ref <200)
HDL: 56 mg/dL (ref >=50)
LDL Cholesterol (Calc): 52 mg/dL (calc)
Non-HDL Cholesterol (Calc): 66 mg/dL (calc) (ref <130)
Total CHOL/HDL Ratio: 2.2 (calc) (ref <5.0)
Triglycerides: 68 mg/dL (ref <150)

## 2019-02-11 MED ORDER — AMPHETAMINE-DEXTROAMPHETAMINE 20 MG PO TABS: ORAL_TABLET | ORAL | 0 refills | Status: AC

## 2019-02-11 MED ORDER — SPRINTEC 28 0.25-35 MG-MCG PO TABS: 1.0000 | ORAL_TABLET | Freq: Every day | ORAL | 11 refills | Status: DC

## 2019-02-11 NOTE — Patient Instructions (Signed)
Health Maintenance, Female Adopting a healthy lifestyle and getting preventive care are important in promoting health and wellness. Ask your health care provider about:  The right schedule for you to have regular tests and exams.  Things you can do on your own to prevent diseases and keep yourself healthy. What should I know about diet, weight, and exercise? Eat a healthy diet   Eat a diet that includes plenty of vegetables, fruits, low-fat dairy products, and lean protein.  Do not eat a lot of foods that are high in solid fats, added sugars, or sodium. Maintain a healthy weight Body mass index (BMI) is used to identify weight problems. It estimates body fat based on height and weight. Your health care provider can help determine your BMI and help you achieve or maintain a healthy weight. Get regular exercise Get regular exercise. This is one of the most important things you can do for your health. Most adults should:  Exercise for at least 150 minutes each week. The exercise should increase your heart rate and make you sweat (moderate-intensity exercise).  Do strengthening exercises at least twice a week. This is in addition to the moderate-intensity exercise.  Spend less time sitting. Even light physical activity can be beneficial. Watch cholesterol and blood lipids Have your blood tested for lipids and cholesterol at 26 years of age, then have this test every 5 years. Have your cholesterol levels checked more often if:  Your lipid or cholesterol levels are high.  You are older than 26 years of age.  You are at high risk for heart disease. What should I know about cancer screening? Depending on your health history and family history, you may need to have cancer screening at various ages. This may include screening for:  Breast cancer.  Cervical cancer.  Colorectal cancer.  Skin cancer.  Lung cancer. What should I know about heart disease, diabetes, and high blood  pressure? Blood pressure and heart disease  High blood pressure causes heart disease and increases the risk of stroke. This is more likely to develop in people who have high blood pressure readings, are of African descent, or are overweight.  Have your blood pressure checked: ? Every 3-5 years if you are 18-39 years of age. ? Every year if you are 40 years old or older. Diabetes Have regular diabetes screenings. This checks your fasting blood sugar level. Have the screening done:  Once every three years after age 40 if you are at a normal weight and have a low risk for diabetes.  More often and at a younger age if you are overweight or have a high risk for diabetes. What should I know about preventing infection? Hepatitis B If you have a higher risk for hepatitis B, you should be screened for this virus. Talk with your health care provider to find out if you are at risk for hepatitis B infection. Hepatitis C Testing is recommended for:  Everyone born from 1945 through 1965.  Anyone with known risk factors for hepatitis C. Sexually transmitted infections (STIs)  Get screened for STIs, including gonorrhea and chlamydia, if: ? You are sexually active and are younger than 26 years of age. ? You are older than 26 years of age and your health care provider tells you that you are at risk for this type of infection. ? Your sexual activity has changed since you were last screened, and you are at increased risk for chlamydia or gonorrhea. Ask your health care provider if   you are at risk.  Ask your health care provider about whether you are at high risk for HIV. Your health care provider may recommend a prescription medicine to help prevent HIV infection. If you choose to take medicine to prevent HIV, you should first get tested for HIV. You should then be tested every 3 months for as long as you are taking the medicine. Pregnancy  If you are about to stop having your period (premenopausal) and  you may become pregnant, seek counseling before you get pregnant.  Take 400 to 800 micrograms (mcg) of folic acid every day if you become pregnant.  Ask for birth control (contraception) if you want to prevent pregnancy. Osteoporosis and menopause Osteoporosis is a disease in which the bones lose minerals and strength with aging. This can result in bone fractures. If you are 65 years old or older, or if you are at risk for osteoporosis and fractures, ask your health care provider if you should:  Be screened for bone loss.  Take a calcium or vitamin D supplement to lower your risk of fractures.  Be given hormone replacement therapy (HRT) to treat symptoms of menopause. Follow these instructions at home: Lifestyle  Do not use any products that contain nicotine or tobacco, such as cigarettes, e-cigarettes, and chewing tobacco. If you need help quitting, ask your health care provider.  Do not use street drugs.  Do not share needles.  Ask your health care provider for help if you need support or information about quitting drugs. Alcohol use  Do not drink alcohol if: ? Your health care provider tells you not to drink. ? You are pregnant, may be pregnant, or are planning to become pregnant.  If you drink alcohol: ? Limit how much you use to 0-1 drink a day. ? Limit intake if you are breastfeeding.  Be aware of how much alcohol is in your drink. In the [place] one drink equals one 12 oz bottle of beer (355 mL), one 5 oz glass of wine (148 mL), or one 1 oz glass of hard liquor (44 mL). General instructions  Schedule regular health, dental, and eye exams.  Stay current with your vaccines.  Tell your health care provider if: ? You often feel depressed. ? You have ever been abused or do not feel safe at home. Summary  Adopting a healthy lifestyle and getting preventive care are important in promoting health and wellness.  Follow your health care provider's instructions about healthy  diet, exercising, and getting tested or screened for diseases.  Follow your health care provider's instructions on monitoring your cholesterol and blood pressure. This information is not intended to replace advice given to you by your health care provider. Make sure you discuss any questions you have with your health care provider. Document Revised: 01/03/2018 Document Reviewed: 01/03/2018 Elsevier Patient Education  2020 [employer]  

## 2019-02-11 NOTE — Telephone Encounter (Signed)
Received fax from [employer] that Adderall requires a PA. Information has been sent to the insurance company. Awaiting determination.   

## 2019-02-11 NOTE — Progress Notes (Signed)
Subjective:     [name] is a 24 y.o. female and is here for a comprehensive physical exam. The patient reports no problems.   Social History   Socioeconomic History  . Marital status: Single    Spouse name: Not on file  . Number of children: Not on file  . Years of education: Not on file  . Highest education level: Not on file  Occupational History  . Not on file  Tobacco Use  . Smoking status: Never Smoker  . Smokeless tobacco: Never Used  Substance and Sexual Activity  . Alcohol use: Not on file  . Drug use: Not on file  . Sexual activity: Yes    Comment: lives with mother and father and brother, born in [place], now in [place].  Other Topics Concern  . Not on file  Social History Narrative   Works at [employer]. In school and hoping to get into [employer]et school    Social Determinants of Health   Financial Resource Strain:   . Difficulty of Paying Living Expenses: Not on file  Food Insecurity:   . Worried About Charity fundraiser in the Last Year: Not on file  . Ran Out of Food in the Last Year: Not on file  Transportation Needs:   . Lack of Transportation (Medical): Not on file  . Lack of Transportation (Non-Medical): Not on file  Physical Activity:   . Days of Exercise per Week: Not on file  . Minutes of Exercise per Session: Not on file  Stress:   . Feeling of Stress : Not on file  Social Connections:   . Frequency of Communication with Friends and Family: Not on file  . Frequency of Social Gatherings with Friends and Family: Not on file  . Attends Religious Services: Not on file  . Active Member of Clubs or Organizations: Not on file  . Attends Archivist Meetings: Not on file  . Marital Status: Not on file  Intimate Partner Violence:   . Fear of Current or Ex-Partner: Not on file  . Emotionally Abused: Not on file  . Physically Abused: Not on file  . Sexually Abused: Not on file   Health Maintenance  Topic Date Due  . PAP-Cervical  Cytology Screening  08/19/2017  . PAP SMEAR-Modifier  08/19/2017  . TETANUS/TDAP  07/13/2024  . INFLUENZA VACCINE  Completed  . HIV Screening  Completed    The following portions of the patient's history were reviewed and updated as appropriate: allergies, current medications, past family history, past medical history, past social history, past surgical history and problem list.  Review of Systems A comprehensive review of systems was negative.   Objective:    BP 113/74   Pulse 100   Ht 5\' 5"  (1.651 m)   Wt 114 lb (51.7 kg)   LMP 02/01/2019 (Exact Date)   SpO2 99%   BMI 18.97 kg/m  General appearance: alert, cooperative and appears stated age Head: Normocephalic, without obvious abnormality, atraumatic Eyes: conj clear, EOMI, PEERLA Ears: normal TM's and external ear canals both ears Nose: not examined Throat: lips, mucosa, and tongue normal; teeth and gums normal Neck: no adenopathy, no carotid bruit, no JVD, supple, symmetrical, trachea midline and thyroid not enlarged, symmetric, no tenderness/mass/nodules Back: symmetric, no curvature. ROM normal. No CVA tenderness. Lungs: clear to auscultation bilaterally Breasts: normal appearance, no masses or tenderness Heart: regular rate and rhythm, S1, S2 normal, no murmur, click, rub or gallop Abdomen:  soft, non-tender; bowel sounds normal; no masses,  no organomegaly Pelvic: cervix normal in appearance, external genitalia normal, no adnexal masses or tenderness, no cervical motion tenderness, rectovaginal septum normal, uterus normal size, shape, and consistency and vagina normal without discharge Extremities: extremities normal, atraumatic, no cyanosis or edema Pulses: 2+ and symmetric Skin: Skin color, texture, turgor normal. No rashes or lesions Lymph nodes: Cervical, supraclavicular, and axillary nodes normal. Neurologic: Alert and oriented X 3, normal strength and tone. Normal symmetric reflexes. Normal coordination and gait     Assessment:    Healthy female exam.      Plan:     See After Visit Summary for Counseling Recommendations   Keep up a regular exercise program and make sure you are eating a healthy diet Try to eat 4 servings of dairy a day, or if you are lactose intolerant take a calcium with vitamin D daily.  Your vaccines are up to date.  Pap smear performed.  Record results once available. Birth control refilled.  ADHD -refilled Adderall for the next 90 days.

## 2019-02-12 NOTE — Progress Notes (Signed)
All labs are normal. 

## 2019-02-12 NOTE — Telephone Encounter (Signed)
Received a fax from [facility] that Adderall is approved from 02/11/2019 through 02/10/2022. Pharmacy aware and form sent to scan.

## 2019-02-13 ENCOUNTER — Other Ambulatory Visit: Payer: Self-pay | Admitting: *Deleted

## 2019-02-13 DIAGNOSIS — A630 Anogenital (venereal) warts: Secondary | ICD-10-CM

## 2019-02-13 DIAGNOSIS — R87612 Low grade squamous intraepithelial lesion on cytologic smear of cervix (LGSIL): Secondary | ICD-10-CM

## 2019-02-13 LAB — CYTOLOGY - PAP
Chlamydia: NEGATIVE
Comment: NEGATIVE
Comment: NEGATIVE
Comment: NEGATIVE
Comment: NORMAL
HPV 16: NEGATIVE
HPV 18 / 45: NEGATIVE
High risk HPV: POSITIVE — AB
Neisseria Gonorrhea: NEGATIVE

## 2019-02-14 ENCOUNTER — Telehealth: Payer: Self-pay

## 2019-02-14 NOTE — Telephone Encounter (Signed)
[name] called and left a message updating her phone number for the referral. I did update her chart.   [phone]

## 2019-02-15 NOTE — Telephone Encounter (Signed)
Thank you I will make sure office has correct number - CF

## 2019-02-18 ENCOUNTER — Encounter: Payer: Self-pay | Admitting: Family Medicine

## 2019-11-15 ENCOUNTER — Encounter: Payer: Self-pay | Admitting: Family Medicine

## 2019-11-26 ENCOUNTER — Other Ambulatory Visit: Payer: Self-pay | Admitting: Family Medicine
